# Patient Record
Sex: Female | Born: 1975 | Hispanic: No | Marital: Single | Smoking: Never smoker
Health system: Southern US, Community
[De-identification: ages and names within clinical notes are randomized; demographics above are authoritative.]

## PROBLEM LIST (undated history)

## (undated) DIAGNOSIS — F329 Major depressive disorder, single episode, unspecified: Secondary | ICD-10-CM

## (undated) DIAGNOSIS — F3289 Other specified depressive episodes: Secondary | ICD-10-CM

## (undated) DIAGNOSIS — S99921A Unspecified injury of right foot, initial encounter: Secondary | ICD-10-CM

## (undated) DIAGNOSIS — G43909 Migraine, unspecified, not intractable, without status migrainosus: Secondary | ICD-10-CM

## (undated) DIAGNOSIS — F319 Bipolar disorder, unspecified: Secondary | ICD-10-CM

## (undated) DIAGNOSIS — M3500 Sicca syndrome, unspecified: Secondary | ICD-10-CM

## (undated) HISTORY — DX: Other specified depressive episodes: F32.89

## (undated) HISTORY — DX: Major depressive disorder, single episode, unspecified: F32.9

## (undated) HISTORY — DX: Unspecified injury of right foot, initial encounter: S99.921A

## (undated) HISTORY — DX: Migraine, unspecified, not intractable, without status migrainosus: G43.909

## (undated) HISTORY — DX: Morbid (severe) obesity due to excess calories: E66.01

## (undated) HISTORY — DX: Sjogren syndrome, unspecified: M35.00

## (undated) HISTORY — DX: Bipolar disorder, unspecified: F31.9

---

## 2001-12-25 DIAGNOSIS — S99921A Unspecified injury of right foot, initial encounter: Secondary | ICD-10-CM

## 2001-12-25 HISTORY — DX: Unspecified injury of right foot, initial encounter: S99.921A

## 2001-12-25 HISTORY — PX: OTHER SURGICAL HISTORY: SHX169

## 2004-12-25 HISTORY — PX: CHOLECYSTECTOMY: SHX55

## 2006-12-25 HISTORY — PX: ABDOMINAL HYSTERECTOMY: SHX81

## 2010-02-07 ENCOUNTER — Encounter: Payer: Self-pay | Admitting: Internal Medicine

## 2010-02-07 LAB — CONVERTED CEMR LAB
RDW: 14.8 %
WBC: 6.6 10*3/uL

## 2010-05-25 LAB — CONVERTED CEMR LAB

## 2011-01-02 ENCOUNTER — Other Ambulatory Visit: Payer: Self-pay | Admitting: Internal Medicine

## 2011-01-02 ENCOUNTER — Ambulatory Visit
Admission: RE | Admit: 2011-01-02 | Discharge: 2011-01-02 | Payer: Self-pay | Source: Home / Self Care | Attending: Internal Medicine | Admitting: Internal Medicine

## 2011-01-02 DIAGNOSIS — G43909 Migraine, unspecified, not intractable, without status migrainosus: Secondary | ICD-10-CM | POA: Insufficient documentation

## 2011-01-02 DIAGNOSIS — F329 Major depressive disorder, single episode, unspecified: Secondary | ICD-10-CM | POA: Insufficient documentation

## 2011-01-02 DIAGNOSIS — F3289 Other specified depressive episodes: Secondary | ICD-10-CM | POA: Insufficient documentation

## 2011-01-02 LAB — CBC WITH DIFFERENTIAL/PLATELET
Basophils Absolute: 0 10*3/uL (ref 0.0–0.1)
Basophils Relative: 0.3 % (ref 0.0–3.0)
Eosinophils Absolute: 0.1 10*3/uL (ref 0.0–0.7)
Eosinophils Relative: 2 % (ref 0.0–5.0)
HCT: 38 % (ref 36.0–46.0)
Hemoglobin: 12.8 g/dL (ref 12.0–15.0)
Lymphocytes Relative: 28.1 % (ref 12.0–46.0)
Lymphs Abs: 1.5 10*3/uL (ref 0.7–4.0)
MCHC: 33.6 g/dL (ref 30.0–36.0)
MCV: 78 fl (ref 78.0–100.0)
Monocytes Absolute: 0.4 10*3/uL (ref 0.1–1.0)
Monocytes Relative: 6.9 % (ref 3.0–12.0)
Neutro Abs: 3.4 10*3/uL (ref 1.4–7.7)
Neutrophils Relative %: 62.7 % (ref 43.0–77.0)
Platelets: 243 10*3/uL (ref 150.0–400.0)
RBC: 4.87 Mil/uL (ref 3.87–5.11)
RDW: 16.6 % — ABNORMAL HIGH (ref 11.5–14.6)
WBC: 5.5 10*3/uL (ref 4.5–10.5)

## 2011-01-02 LAB — URINALYSIS, ROUTINE W REFLEX MICROSCOPIC
Bilirubin Urine: NEGATIVE
Hemoglobin, Urine: NEGATIVE
Ketones, ur: NEGATIVE
Leukocytes, UA: NEGATIVE
Nitrite: NEGATIVE
Specific Gravity, Urine: 1.01 (ref 1.000–1.030)
Total Protein, Urine: NEGATIVE
Urine Glucose: NEGATIVE
Urobilinogen, UA: 0.2 (ref 0.0–1.0)
pH: 6 (ref 5.0–8.0)

## 2011-01-02 LAB — HEPATIC FUNCTION PANEL
ALT: 26 U/L (ref 0–35)
AST: 19 U/L (ref 0–37)
Albumin: 4.2 g/dL (ref 3.5–5.2)
Alkaline Phosphatase: 103 U/L (ref 39–117)
Bilirubin, Direct: 0.1 mg/dL (ref 0.0–0.3)
Total Bilirubin: 0.5 mg/dL (ref 0.3–1.2)
Total Protein: 7.8 g/dL (ref 6.0–8.3)

## 2011-01-02 LAB — BASIC METABOLIC PANEL
BUN: 11 mg/dL (ref 6–23)
CO2: 28 mEq/L (ref 19–32)
Calcium: 9.6 mg/dL (ref 8.4–10.5)
Chloride: 108 mEq/L (ref 96–112)
Creatinine, Ser: 0.7 mg/dL (ref 0.4–1.2)
GFR: 96.4 mL/min (ref >60.00)
Glucose, Bld: 84 mg/dL (ref 70–99)
Potassium: 4.5 mEq/L (ref 3.5–5.1)
Sodium: 143 mEq/L (ref 135–145)

## 2011-01-02 LAB — LIPID PANEL
Cholesterol: 190 mg/dL (ref 0–200)
HDL: 28 mg/dL — ABNORMAL LOW (ref >39.00)
Total CHOL/HDL Ratio: 7
Triglycerides: 207 mg/dL — ABNORMAL HIGH (ref 0.0–149.0)
VLDL: 41.4 mg/dL — ABNORMAL HIGH (ref 0.0–40.0)

## 2011-01-02 LAB — TSH: TSH: 1.57 u[IU]/mL (ref 0.35–5.50)

## 2011-01-02 LAB — LDL CHOLESTEROL, DIRECT: Direct LDL: 130.2 mg/dL

## 2011-01-05 ENCOUNTER — Encounter: Payer: Self-pay | Admitting: Internal Medicine

## 2011-01-26 NOTE — Assessment & Plan Note (Signed)
Summary: New / cd   Vital Signs:  Patient profile:   35 year old female Height:      71 inches (180.34 cm) Weight:      235.0 pounds (106.82 kg) BMI:     32.89 O2 Sat:      97 % on Room air Temp:     98.6 degrees F (37.00 degrees C) oral Pulse rate:   80 / minute BP sitting:   102 / 72  (left arm) Cuff size:   large  Vitals Entered By: Orlan Leavens RMA (January 02, 2011 1:05 PM)  O2 Flow:  Room air CC: New patient Is Patient Diabetic? No Pain Assessment Patient in pain? no        Primary Care Provider:  Newt Lukes MD  CC:  New patient.  History of Present Illness: new pt to me and our practice, here to est care prev followed with dr. Fayrene Fearing black, gyn in [place]  patient is here today for annual physical. Patient feels well and has no complaints.   reviewed chronic med issues: bipolar depression - prev zoloft and lexapro (weight gain), then on lamictal (allg rxn) - started on abilify 11/2010 - not helpful? 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 and infx at [facility] - recurrent growth, now following at [facility] for same- ?repeat surg plans - constant pain, esp WB which limits exercise capacity  obesity - fustrated with weight gain since TAH/BSO in 2008 - complicated by tx with zoloft and lexapro for depression - follows limited calorie low fat and low carb diet but only tolerable exercise is swimming - only swims in summer  Preventive Screening-Counseling & Management  Alcohol-Tobacco     Alcohol drinks/day: 0     Alcohol Counseling: not indicated; patient does not drink     Smoking Status: never     Tobacco Counseling: not indicated; no tobacco use  Caffeine-Diet-Exercise     Does Patient Exercise: no  Safety-Violence-Falls     Seat Belt Counseling: not indicated; patient wears seat belts     Helmet Counseling: not applicable     Firearm Counseling: not applicable     Violence  Counseling: not applicable  Clinical Review Panels:  Prevention   Last Pap Smear:  Interpretation Result:Negative for intraepithelial Lesion or Malignancy.    (05/25/2010)  Immunizations   Last Tetanus Booster:  Historical (12/26/2007)   Current Medications (verified): 1)  Abilify 20 Mg Tabs (Aripiprazole) .... Take 1 By Mouth Once Daily 2)  Clonazepam 0.5 Mg Tabs (Clonazepam) .... Take 1-3 By Mouth At Bedtime  Allergies (verified): 1)  ! Cipro 2)  ! * Zosyn 3)  ! Vancomycin 4)  ! * Cephamine 5)  ! 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  Md roster: ortho onc -brigman @ [facility] psyc - [facility] ([place]) ENT - [facility]  Past Surgical History: Cholecystectomy (2006) Hysterectomy and bso - (2008) R foot tumor resection 2003  Family History: Family History of Alcoholism/Addiction (other relative) Family History of Arthritis (parent, grandparent) Family History Diabetes 1st degree relative (grandparent) Family History Hypertension (parent)  Social History: Never Smoked no alcohol single, lives with 10dogs not in relationship works with Systems developer Smoking Status:  never Does Patient Exercise:  no  Review of Systems       see HPI above. I have reviewed all other systems  and they were negative.   Physical Exam  General:  overweight-appearing.  alert, well-developed, well-nourished, and cooperative to examination.    Head:  Normocephalic and atraumatic without obvious abnormalities. No apparent alopecia or balding. Eyes:  vision grossly intact; pupils equal, round and reactive to light.  conjunctiva and lids normal.    Ears:  normal pinnae bilaterally, without erythema, swelling, or tenderness to palpation. TMs clear, without effusion, or cerumen impaction. Hearing grossly normal bilaterally  Nose:  External nasal examination shows no deformity or inflammation. Nasal mucosa are pink and  moist without lesions or exudates. Mouth:  teeth and gums in good repair; mucous membranes moist, without lesions or ulcers. oropharynx clear without exudate, no erythema.  Neck:  thick, supple, full ROM, no masses, no thyromegaly; no thyroid nodules or tenderness. no JVD or carotid bruits.   Lungs:  normal respiratory effort, no intercostal retractions or use of accessory muscles; normal breath sounds bilaterally - no crackles and no wheezes.    Heart:  normal rate, regular rhythm, no murmur, and no rub. BLE without edema.  Abdomen:  soft, non-tender, normal bowel sounds, no distention; no masses and no appreciable hepatomegaly or splenomegaly.   Genitalia:  defer gyn Msk:  diffuse soft tissue swelling and firmness around right hindfoot - scar on sole in midfoot - FROM but painful with weightbearing Neurologic:  alert & oriented X3 and cranial nerves II-XII symetrically intact.  strength normal in all extremities, sensation intact to light touch, and gait normal. speech fluent without dysarthria or aphasia; follows commands with good comprehension.  Skin:  no rashes, vesicles, ulcers, or erythema. No nodules or irregularity to palpation.  Psych:  Oriented X3, memory intact for recent and remote, normally interactive, good eye contact, not anxious appearing, not depressed appearing, and not agitated.      Impression & Recommendations:  Problem # 1:  PREVENTIVE HEALTH CARE (ICD-V70.0) Assessment New Patient has been counseled on age-appropriate routine health concerns for screening and prevention. These are reviewed and up-to-date. Immunizations are up-to-date or declined. Labs ordered and will be reviewed. 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 Take 1-3 by mouth at bedtime  Complete Medication List: 1)  Abilify 20 Mg Tabs (Aripiprazole) .... Take 1 by mouth once daily 2)  Clonazepam 0.5 Mg Tabs (Clonazepam) .... Take 1-3 by mouth at bedtime  Patient Instructions: 1)  it was good to see you today. 2)  test(s) ordered today - your results will be called to you after review in 48-72 hours from the time of test completion 3)  will send for records from dr. Vedia Coffer  to review as discussed 4)  continue to follow with your gynecologist and other specialists as ongoing including dr. Marciano Sequin, [facility]. 5)  no medication changes recommended at this time 6)  Please schedule a follow-up appointment in 3-6 months to continue review and weight check, call sooner if problems.    Orders Added: 1)  TLB-BMP (Basic Metabolic Panel-BMET) [80048-METABOL] 2)  TLB-Lipid Panel [80061-LIPID] 3)  TLB-CBC Platelet - w/Differential [85025-CBCD] 4)  TLB-Hepatic/Liver Function Pnl [80076-HEPATIC] 5)  TLB-TSH (Thyroid Stimulating Hormone) [84443-TSH] 6)  TLB-Udip w/ Micro [81001-URINE] 7)  New Patient 18-39 years [99385]   Immunization History:  Tetanus/Td Immunization History:    Tetanus/Td:  historical (12/26/2007)   Immunization History:  Tetanus/Td  Immunization History:    Tetanus/Td:  Historical (12/26/2007)    Pap Smear  Procedure date:  05/25/2010  Findings:      Interpretation Result:Negative for intraepithelial Lesion or Malignancy.

## 2011-03-15 ENCOUNTER — Encounter: Payer: Self-pay | Admitting: *Deleted

## 2011-04-04 ENCOUNTER — Ambulatory Visit: Payer: Self-pay | Admitting: Internal Medicine

## 2011-04-05 ENCOUNTER — Ambulatory Visit (INDEPENDENT_AMBULATORY_CARE_PROVIDER_SITE_OTHER): Payer: BLUE CROSS/BLUE SHIELD | Admitting: Internal Medicine

## 2011-04-05 ENCOUNTER — Encounter: Payer: Self-pay | Admitting: Internal Medicine

## 2011-04-05 VITALS — BP 110/72 | HR 74 | Temp 98.5°F | Ht 71.0 in | Wt 234.1 lb

## 2011-04-05 DIAGNOSIS — K589 Irritable bowel syndrome without diarrhea: Secondary | ICD-10-CM

## 2011-04-05 DIAGNOSIS — F3289 Other specified depressive episodes: Secondary | ICD-10-CM

## 2011-04-05 DIAGNOSIS — F329 Major depressive disorder, single episode, unspecified: Secondary | ICD-10-CM

## 2011-04-05 MED ORDER — OMEPRAZOLE MAGNESIUM 20 MG PO TBEC: 20.0000 mg | DELAYED_RELEASE_TABLET | Freq: Every day | ORAL | Status: DC

## 2011-04-05 NOTE — Assessment & Plan Note (Signed)
Counseled on need for balance of calories in/out to achieve weight loss Wt Readings from Last 3 Encounters:  04/05/11 234 lb 1.9 oz (106.196 kg)  01/02/11 235 lb (106.595 kg)

## 2011-04-05 NOTE — Assessment & Plan Note (Signed)
Increase symptoms of motivational and physical fatigue - Recent 12/2010 labs reviewed - no evidence for other medical problem to explain same Hesitation to add stimulant due to history of bipolar - the patient will review symptoms and possible med changes with her Psychiatrist in [place] ([place])

## 2011-04-05 NOTE — Progress Notes (Signed)
  Subjective:    Patient ID: Monica May, female    DOB: 1975-12-26, 35 y.o.   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 symptoms occur 2-3 x/week, not constant. describes as burning. Improved with prilosec. associated with alternating diarrhea dn constipation. No pain at this time. No weight change, no nausea and vomiting. Worse with stress, not changed with meals.  Past Medical History  Diagnosis Date  . OBESITY, MORBID 01/02/2011  . DEPRESSION 01/02/2011  . MIGRAINE HEADACHE 01/02/2011  . Bipolar affective disorder   . Injury of right foot     Tumor (R) foot    Review of Systems  Constitutional: Positive for fatigue. Negative for unexpected weight change.  Respiratory: Negative for shortness of breath.   Cardiovascular: Negative for chest pain.       Objective:   Physical Exam  Constitutional: She appears well-developed and well-nourished. No distress.       overweight  Cardiovascular: Normal rate, regular rhythm and normal heart sounds.   No murmur heard. Pulmonary/Chest: Effort normal and breath sounds normal. No respiratory distress. She has  no wheezes.  Abdominal: Soft. Bowel sounds are normal. She exhibits no distension. There is no tenderness.   BP 110/72  Pulse 74  Temp(Src) 98.5 F (36.9 C) (Oral)  Ht 5\' 11"  (1.803 m)  Wt 234 lb 1.9 oz (106.196 kg)  BMI 32.65 kg/m2  SpO2 98% Wt Readings from Last 3 Encounters:  04/05/11 234 lb 1.9 oz (106.196 kg)  01/02/11 235 lb (106.595 kg)       Lab Results  Component Value Date   WBC 5.5 01/02/2011   HGB 12.8 01/02/2011   HGB NEGATIVE 01/02/2011   HCT 38.0 01/02/2011   PLT 243.0 01/02/2011   CHOL 190 01/02/2011   TRIG 207.0* 01/02/2011   HDL 28.00* 01/02/2011   LDLDIRECT 130.2 01/02/2011   ALT 26 01/02/2011   AST 19 01/02/2011   NA 143 01/02/2011   K 4.5 01/02/2011   CL 108 01/02/2011   CREATININE 0.7 01/02/2011   BUN 11 01/02/2011   CO2 28 01/02/2011   TSH 1.57 01/02/2011    Assessment & Plan:  See problem list. Medications and labs reviewed today. Time spent with the patient 25 minutes, greater than 50% time spent counseling patient on depression, IBS and medication review. Also review of prior records

## 2011-04-05 NOTE — Assessment & Plan Note (Signed)
Alternating diarrhea with constipation and "nervous stomach" with stress No red flags on hx or exam Previously intolerant of SSRI but will review other options with Psychiatry as for increase depression symptoms recommended trial of Align or other probiotic to help with same

## 2011-04-05 NOTE — Patient Instructions (Signed)
It was good to see you today. Try Align for stomach and bowels as discussed - ok to continue prilosec as needed Copy of 1.2012 labs given to you Other medications reviewed, no changes at this time but discuss possible changeoin depression treatment with Dr. Marciano Sequin Please schedule followup in 6 months, call sooner if problems.

## 2011-04-24 ENCOUNTER — Encounter: Payer: Self-pay | Admitting: Internal Medicine

## 2011-04-24 ENCOUNTER — Ambulatory Visit (INDEPENDENT_AMBULATORY_CARE_PROVIDER_SITE_OTHER): Payer: BLUE CROSS/BLUE SHIELD | Admitting: Internal Medicine

## 2011-04-24 VITALS — BP 90/72 | HR 91 | Temp 98.6°F

## 2011-04-24 DIAGNOSIS — J020 Streptococcal pharyngitis: Secondary | ICD-10-CM

## 2011-04-24 MED ORDER — MAGIC MOUTHWASH W/LIDOCAINE: 10.0000 mL | Freq: Three times a day (TID) | ORAL | Status: DC | PRN

## 2011-04-24 MED ORDER — CLARITHROMYCIN 500 MG PO TABS: 500.0000 mg | ORAL_TABLET | Freq: Two times a day (BID) | ORAL | Status: AC

## 2011-04-24 NOTE — Progress Notes (Signed)
  Subjective:    Patient ID: Monica May, female    DOB: 1976/01/11, 35 y.o.   MRN: [id number]  HPI Here with ST - Onset 5 days ago Dx with strep in [place] at [facility] (+rapid and swab) - tx with Zpak due to pcn and ceph allg - Fever improved but still painful swallowing and swollen tonsils+LNs No sinus pain/pressure, no cough or sputum  Also reviewed chronic med issues: bipolar depression - prev zoloft and lexapro (weight gain), then on lamictal (allg rxn) - started on abilify 11/2010 - not helpful? 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 OBESITY, MORBID 01/02/2011  . DEPRESSION 01/02/2011  . MIGRAINE HEADACHE 01/02/2011  . Bipolar affective disorder     dr. Marciano Sequin ([place])  . 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 Sjogrens syndrome     Review of Systems  Constitutional: Negative for fever and fatigue.  HENT: Positive for sore throat and trouble swallowing. Negative for mouth sores and voice change.   Respiratory: Negative for cough.   Cardiovascular: Negative for chest pain.       Objective:   Physical Exam  Constitutional: She is oriented to person, place, and time. She appears well-developed and well-nourished.  HENT:  Mouth/Throat: Oropharyngeal exudate present.       OP with moderate erythema and tonsil enlargement bilaterally - no thrush  Cardiovascular: Normal rate, regular rhythm and normal heart sounds.   Pulmonary/Chest: Effort  normal and breath sounds normal. No respiratory distress.  Neurological: She is alert and oriented to person, place, and time.        Assessment & Plan:  Strep pharyngitis - change abx to different macrolide given allg to PCN and ceph abx - also topical symptomatic relief with MM+lido erx done - cx not repeated as just done and (+) with [place] provider per pt report

## 2011-04-24 NOTE — Patient Instructions (Signed)
It was good to see you today. Stop Zpak and start Biaxin - also magic mouth wash with lidocain for throat pain -  Your prescription(s) have been submitted to your pharmacy. Please take as directed and contact our office if you believe you are having problem(s) with the medication(s). Keep follow up as previously scheduled, sooner if problems

## 2011-10-04 ENCOUNTER — Ambulatory Visit (INDEPENDENT_AMBULATORY_CARE_PROVIDER_SITE_OTHER): Payer: BLUE CROSS/BLUE SHIELD | Admitting: Internal Medicine

## 2011-10-04 ENCOUNTER — Encounter: Payer: Self-pay | Admitting: Internal Medicine

## 2011-10-04 DIAGNOSIS — F329 Major depressive disorder, single episode, unspecified: Secondary | ICD-10-CM

## 2011-10-04 NOTE — Assessment & Plan Note (Signed)
Stable symptoms - well controlled at this time Recent 12/2010 labs reviewed - no evidence for other medical problem to explain same Hesitation to add stimulant due to history of bipolar, but tolerated phenmteramine well summer 2012 (only no weight loss)  the patient will monitor for increase symptoms with her Psychiatrist in [place] ([place])

## 2011-10-04 NOTE — Patient Instructions (Addendum)
It was good to see you today. Try Qsymia (may not yet be at your pharmacy) - prescription and info from FDA on safety given to you today .IF successful weight loss and you wish to continue after 60days use - cal here for visit and weight check to review medication Other Mmdications reviewed, no changes at this time. Otherwise, please schedule followup in 6 months, call sooner if problems.

## 2011-10-04 NOTE — Progress Notes (Signed)
  Subjective:    Patient ID: Monica May, female    DOB: 01/04/1976, 35 y.o.   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 symptoms occur 2-3 x/week, not constant. describes as burning. Improved with prilosec. associated with alternating diarrhea dn constipation. No pain at this time. No weight change, no nausea and vomiting. Worse with stress, not changed with meals.  Past Medical History  Diagnosis Date  . OBESITY, MORBID   . DEPRESSION   . MIGRAINE HEADACHE   . Bipolar affective disorder     dr. Marciano Sequin ([place])  . 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 Sjogrens syndrome     Review of Systems  Constitutional: Positive for fatigue. Negative for unexpected weight change.  Respiratory: Negative for shortness of breath.   Cardiovascular: Negative for chest pain.       Objective:   Physical Exam BP 118/82  Pulse 81  Temp(Src) 98.4 F (36.9 C) (Oral)  Ht 5\' 11"  (1.803 m)  Wt 235 lb 12.8 oz (106.958 kg)  BMI 32.89 kg/m2  SpO2  98% Wt Readings from Last 3 Encounters:  10/04/11 235 lb 12.8 oz (106.958 kg)  04/05/11 234 lb 1.9 oz (106.196 kg)  01/02/11 235 lb (106.595 kg)   Constitutional: She is overweight, appears well-developed and well-nourished. No distress.  Neck: Normal range of motion. Neck supple. No JVD present. No thyromegaly present.  Cardiovascular: Normal rate, regular rhythm and normal heart sounds.  No murmur heard. No BLE edema. Pulmonary/Chest: Effort normal and breath sounds normal. No respiratory distress. She has no wheezes.  Skin: Skin is warm and dry. No rash noted. No erythema.  Psychiatric: She has a normal mood and affect. Her behavior is normal. Judgment and thought content normal.       Lab Results  Component Value Date   WBC 5.5 01/02/2011   HGB 12.8 01/02/2011   HCT 38.0 01/02/2011   PLT 243.0 01/02/2011   CHOL 190 01/02/2011   TRIG 207.0* 01/02/2011   HDL 28.00* 01/02/2011   LDLDIRECT 130.2 01/02/2011   ALT 26 01/02/2011   AST 19 01/02/2011   NA 143 01/02/2011   K 4.5 01/02/2011   CL 108 01/02/2011   CREATININE 0.7 01/02/2011   BUN 11 01/02/2011   CO2 28 01/02/2011   TSH 1.57 01/02/2011    Assessment & Plan:  See problem list. Medications and labs reviewed today.

## 2011-10-04 NOTE — Assessment & Plan Note (Signed)
Counseled on need for balance of calories in/out to achieve weight loss Has tried Bhcg shots and phenteramine summer 2012 without weight loss Wants to try Qsymia - rx for 30 days done and FDA med safety guide provided as well follow up before more than 2 x to be completed for weight and efficacy check Wt Readings from Last 3 Encounters:  10/04/11 235 lb 12.8 oz (106.958 kg)  04/05/11 234 lb 1.9 oz (106.196 kg)  01/02/11 235 lb (106.595 kg)

## 2011-10-19 ENCOUNTER — Telehealth: Payer: Self-pay | Admitting: *Deleted

## 2011-10-19 NOTE — Telephone Encounter (Signed)
Received PA form for Qsymia contact insurance company faxed over forms fo md to complete. Fax info back. Received approval letter this am med approve for 6 months. Fax approval back to [facility] home delivery @ [phone]...10/19/11@10 :59am/LMB

## 2011-10-30 ENCOUNTER — Encounter: Payer: Self-pay | Admitting: Internal Medicine

## 2011-10-30 ENCOUNTER — Ambulatory Visit (INDEPENDENT_AMBULATORY_CARE_PROVIDER_SITE_OTHER): Payer: BLUE CROSS/BLUE SHIELD | Admitting: Internal Medicine

## 2011-10-30 VITALS — BP 102/72 | HR 89 | Temp 98.6°F

## 2011-10-30 DIAGNOSIS — J209 Acute bronchitis, unspecified: Secondary | ICD-10-CM

## 2011-10-30 DIAGNOSIS — B370 Candidal stomatitis: Secondary | ICD-10-CM

## 2011-10-30 MED ORDER — NYSTATIN 100000 UNIT/ML MT SUSP: 500000.0000 [IU] | Freq: Four times a day (QID) | OROMUCOSAL | Status: AC

## 2011-10-30 MED ORDER — AZITHROMYCIN 250 MG PO TABS: ORAL_TABLET | ORAL | Status: AC

## 2011-10-30 MED ORDER — PROMETHAZINE-CODEINE 6.25-10 MG/5ML PO SYRP: 5.0000 mL | ORAL_SOLUTION | ORAL | Status: AC | PRN

## 2011-10-30 NOTE — Progress Notes (Signed)
  Subjective:    Patient ID: Monica May, female    DOB: [birth year], 35 y.o.   MRN: [id number]  HPI  complains of sore throat associated with "white on tongue" and cough (dark productive sputum) Onset 2 weeks Denies recent antibiotics use No fever or shortness of breath - No relief with OTC cough meds or gargle +sick contacts at work  Past Medical History  Diagnosis Date  . OBESITY, MORBID   . DEPRESSION   . MIGRAINE HEADACHE   . Bipolar affective disorder     dr. Marciano Sequin ([place])  . 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 Sjogrens syndrome      Review of Systems  HENT: Positive for neck stiffness. Negative for hearing loss and postnasal drip.   Respiratory: Negative for shortness of breath and wheezing.        Objective:   Physical Exam BP 102/72  Pulse 89  Temp(Src) 98.6 F (37 C) (Oral)  SpO2 98% Wt Readings from Last 3 Encounters:  10/04/11 235 lb 12.8 oz (106.958 kg)  04/05/11 234 lb 1.9 oz (106.196 kg)  01/02/11 235 lb (106.595 kg)   Constitutional: She appears well-developed and well-nourished. No distress but mildly ill.  HENT: Head: Normocephalic and atraumatic. Ears: B TMs ok, no erythema or effusion; Nose: Nose normal.  Mouth/Throat: Oropharynx is beefy red with thush/exudate coating including tongue.  Eyes: Conjunctivae and EOM are normal. Pupils are equal, round, and reactive to light. No scleral icterus.  Neck: Normal range of motion. Neck supple. No JVD present. No thyromegaly present.  Cardiovascular: Normal rate, regular rhythm and normal heart sounds.  No murmur heard. No BLE edema. Pulmonary/Chest: Effort normal and breath sounds scattered rhonchi. No respiratory distress. She has no wheezes.  Psychiatric: She has a normal mood and affect. Her behavior is normal. Judgment and thought content normal.       Assessment & Plan:  Bronchitis, acute - Zpak and narcotic cough syrup  Thrush -  start nystatin and tx through next 10 days - continue probiotics

## 2011-10-30 NOTE — Patient Instructions (Signed)
It was good to see you today. Zpak antibiotics, codeine cough syrup and nystatin mouthwash - Your prescription(s) have been submitted to your pharmacy. Please take as directed and contact our office if you believe you are having problem(s) with the medication(s).

## 2012-01-10 ENCOUNTER — Ambulatory Visit (INDEPENDENT_AMBULATORY_CARE_PROVIDER_SITE_OTHER): Payer: BLUE CROSS/BLUE SHIELD | Admitting: Internal Medicine

## 2012-01-10 ENCOUNTER — Encounter: Payer: Self-pay | Admitting: Internal Medicine

## 2012-01-10 VITALS — BP 110/70 | HR 98 | Temp 97.2°F

## 2012-01-10 DIAGNOSIS — J069 Acute upper respiratory infection, unspecified: Secondary | ICD-10-CM

## 2012-01-10 MED ORDER — OSELTAMIVIR PHOSPHATE 75 MG PO CAPS: 75.0000 mg | ORAL_CAPSULE | Freq: Two times a day (BID) | ORAL | Status: AC

## 2012-01-10 NOTE — Progress Notes (Signed)
  Subjective:    HPI  complains of cold symptoms  Onset 3 days ago, wax/wane symptoms  associated with rhinorrhea, sneezing, mild headache and fever 101-102 at home Also myalgias, fatigue and mild chest congestion>min productive clear sputum No relief with OTC meds Precipitated by sick contacts  Past Medical History  Diagnosis Date  . OBESITY, MORBID   . DEPRESSION   . MIGRAINE HEADACHE   . Bipolar affective disorder     dr. Marciano Sequin ([place])  . 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 Sjogrens syndrome     Review of Systems Constitutional: No night sweats, no unexpected weight change Pulmonary: No pleurisy or hemoptysis Cardiovascular: No chest pain or palpitations     Objective:   Physical Exam BP 110/70  Pulse 98  Temp(Src) 97.2 F (36.2 C) (Oral)  SpO2 98% GEN: mildly ill appearing and audible head congestion HENT: NCAT, no sinus tenderness bilaterally, nares with clear discharge, oropharynx mild erythema, no exudate Eyes: Vision grossly intact, no conjunctivitis Lungs: Clear to auscultation without rhonchi or wheeze, no increased work of breathing Cardiovascular: Regular rate and rhythm, no bilateral edema      Assessment & Plan:  Viral URI >>suspect flu Cough, postnasal drip related to above   Explained lack of efficacy for antibiotics in viral disease Empiric Tamiflu prescribed due to symptom duration greater than 7 days recommended OTC cough suppression - new prescriptions done Symptomatic care with Tylenol or Advil, hydration and rest -  salt gargle advised as needed

## 2012-01-10 NOTE — Patient Instructions (Signed)
It was good to see you today. Tamiflu - Your prescription(s) have been submitted to your pharmacy. Please take as directed and contact our office if you believe you are having problem(s) with the medication(s). Hydrate, rest and Alternate between ibuprofen and tylenol for aches, pain and fever symptoms as discussed Call if symptoms worse or unimproved

## 2012-02-20 ENCOUNTER — Encounter: Payer: Self-pay | Admitting: Internal Medicine

## 2012-02-20 ENCOUNTER — Ambulatory Visit (INDEPENDENT_AMBULATORY_CARE_PROVIDER_SITE_OTHER)
Admission: RE | Admit: 2012-02-20 | Discharge: 2012-02-20 | Disposition: A | Discharge status: Home / Self Care | Payer: BLUE CROSS/BLUE SHIELD | Source: Ambulatory Visit | Attending: Internal Medicine | Admitting: Internal Medicine

## 2012-02-20 ENCOUNTER — Ambulatory Visit (INDEPENDENT_AMBULATORY_CARE_PROVIDER_SITE_OTHER): Payer: BLUE CROSS/BLUE SHIELD | Admitting: Internal Medicine

## 2012-02-20 VITALS — BP 102/68 | HR 117 | Temp 102.1°F

## 2012-02-20 DIAGNOSIS — R05 Cough: Secondary | ICD-10-CM

## 2012-02-20 DIAGNOSIS — R509 Fever, unspecified: Secondary | ICD-10-CM

## 2012-02-20 MED ORDER — DOXYCYCLINE HYCLATE 100 MG PO TABS: 100.0000 mg | ORAL_TABLET | Freq: Two times a day (BID) | ORAL | Status: AC

## 2012-02-20 MED ORDER — HYDROCODONE-HOMATROPINE 5-1.5 MG/5ML PO SYRP: 5.0000 mL | ORAL_SOLUTION | Freq: Four times a day (QID) | ORAL | Status: AC | PRN

## 2012-02-20 NOTE — Patient Instructions (Signed)
It was good to see you today. Doxycycline antibiotics and Hydromet for cough - Your prescription(s) have been submitted to your pharmacy. Please take as directed and contact our office if you believe you are having problem(s) with the medication(s). Chest x-ray ordered today. Your results will be called to you after review (48-72hours after test completion). If any changes need to be made, you will be notified at that time. Hydrate, rest and Alternate between ibuprofen and tylenol for aches, pain and fever symptoms as discussed Remain out of work until fever resolved for 24 hours Call if symptoms worse or unimproved

## 2012-02-20 NOTE — Progress Notes (Signed)
  Subjective:    Patient ID: Monica May, female    DOB: [birth year], 36 y.o.   MRN: [id number]  HPI  Complains of URI symptoms: Myalgia, dry cough Onset 3 days ago, rapidly progressive Associated with mild sore throat, mild headache and overwhelming fatigue -  Also associated with mild nausea but no vomiting, abdominal pain or diarrhea Seen approximately one month ago here for similar symptoms - Treated at that time with empiric Tamiflu and improved until current symptoms 3 days ago  Past Medical History  Diagnosis Date  . OBESITY, MORBID   . DEPRESSION   . MIGRAINE HEADACHE   . Bipolar affective disorder     dr. Marciano Sequin ([place])  . 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 Sjogrens syndrome     Review of Systems  Constitutional: Positive for fever, chills and fatigue. Negative for unexpected weight change.  HENT: Negative for ear pain, congestion, rhinorrhea, sneezing, neck pain and sinus pressure.   Respiratory: Negative for shortness of breath and wheezing.   Cardiovascular: Negative for chest pain.  Gastrointestinal: Negative for vomiting, abdominal pain and diarrhea.  Genitourinary: Negative for dysuria and flank pain.       Objective:   Physical Exam BP 102/68  Pulse 117  Temp(Src) 102.1 F (38.9 C) (Oral)  SpO2 96% General: Flushed and mildly ill appearing, fatigued but no acute distress HENT: Normocephalic atraumatic, sinuses nontender to palpation, ears without effusion or erythema. Oropharynx without erythema or exudate Eyes: No conjunctivitis Lungs: No increased work of breathing, few scattered rhonchi, no wheeze Cardiovascular: Slight tachycardia, regular rate and rhythm, no bilateral ankle edema Abd: Obese, soft, nontender nondistended, positive bowel sounds Neuro: Awake alert oriented x4, cranial nerves II through XII intact, follows commands, results 2012. Normal coordination, balance and cognition  Lab  Results  Component Value Date   WBC 5.5 01/02/2011   HGB 12.8 01/02/2011   HCT 38.0 01/02/2011   PLT 243.0 01/02/2011   GLUCOSE 84 01/02/2011   CHOL 190 01/02/2011   TRIG 207.0* 01/02/2011   HDL 28.00* 01/02/2011   LDLDIRECT 130.2 01/02/2011   ALT 26 01/02/2011   AST 19 01/02/2011   NA 143 01/02/2011   K 4.5 01/02/2011   CL 108 01/02/2011   CREATININE 0.7 01/02/2011   BUN 11 01/02/2011   CO2 28 01/02/2011   TSH 1.57 01/02/2011        Assessment & Plan:   cough, recurrent URI symptoms Fever Nausea alone, related to cough  Check chest x-ray Empiric doxycycline and hydromet cough suppression Symptomatic care with Tylenol/ibuprofen and hydration To remain out of work until fever resolved x24 hours Call if symptoms worse or unimproved in next 48 hours to reconsider repeat Tamiflu course

## 2012-04-03 ENCOUNTER — Ambulatory Visit (INDEPENDENT_AMBULATORY_CARE_PROVIDER_SITE_OTHER): Payer: BLUE CROSS/BLUE SHIELD | Admitting: Internal Medicine

## 2012-04-03 ENCOUNTER — Other Ambulatory Visit (INDEPENDENT_AMBULATORY_CARE_PROVIDER_SITE_OTHER): Payer: BLUE CROSS/BLUE SHIELD

## 2012-04-03 ENCOUNTER — Telehealth: Payer: Self-pay | Admitting: *Deleted

## 2012-04-03 ENCOUNTER — Encounter: Payer: Self-pay | Admitting: Internal Medicine

## 2012-04-03 VITALS — BP 98/72 | HR 89 | Temp 98.2°F | Ht 71.0 in | Wt 237.8 lb

## 2012-04-03 DIAGNOSIS — Z0000 Encounter for general adult medical examination without abnormal findings: Secondary | ICD-10-CM

## 2012-04-03 LAB — LIPID PANEL
Cholesterol: 195 mg/dL (ref 0–200)
HDL: 26.6 mg/dL — ABNORMAL LOW (ref >39.00)
LDL Cholesterol: 141 mg/dL — ABNORMAL HIGH (ref 0–99)
Total CHOL/HDL Ratio: 7
Triglycerides: 137 mg/dL (ref 0.0–149.0)
VLDL: 27.4 mg/dL (ref 0.0–40.0)

## 2012-04-03 LAB — BASIC METABOLIC PANEL
CO2: 25 mEq/L (ref 19–32)
Calcium: 9.6 mg/dL (ref 8.4–10.5)
Chloride: 110 mEq/L (ref 96–112)
Creatinine, Ser: 1 mg/dL (ref 0.4–1.2)
Sodium: 143 mEq/L (ref 135–145)

## 2012-04-03 LAB — CBC WITH DIFFERENTIAL/PLATELET
Basophils Relative: 0.5 % (ref 0.0–3.0)
Eosinophils Relative: 1.7 % (ref 0.0–5.0)
Hemoglobin: 12.7 g/dL (ref 12.0–15.0)
Lymphocytes Relative: 33.8 % (ref 12.0–46.0)
MCV: 80.9 fl (ref 78.0–100.0)
Neutro Abs: 3 10*3/uL (ref 1.4–7.7)
Neutrophils Relative %: 57.9 % (ref 43.0–77.0)
RBC: 4.78 Mil/uL (ref 3.87–5.11)
WBC: 5.3 10*3/uL (ref 4.5–10.5)

## 2012-04-03 LAB — HEPATIC FUNCTION PANEL
Albumin: 4.4 g/dL (ref 3.5–5.2)
Alkaline Phosphatase: 135 U/L — ABNORMAL HIGH (ref 39–117)
Total Protein: 8.6 g/dL — ABNORMAL HIGH (ref 6.0–8.3)

## 2012-04-03 LAB — URINALYSIS
Ketones, ur: NEGATIVE
Specific Gravity, Urine: 1.015 (ref 1.000–1.030)
Total Protein, Urine: NEGATIVE
Urine Glucose: NEGATIVE

## 2012-04-03 NOTE — Telephone Encounter (Signed)
Message copied by Deatra James on Wed Apr 03, 2012 11:35 AM ------      Message from: COUSIN, SHARON T      Created: Wed Apr 03, 2012 10:01 AM      Regarding: PHY DATE  04/04/13       THANKS

## 2012-04-03 NOTE — Progress Notes (Signed)
Subjective:    Patient ID: Monica May, female    DOB: 1976-10-18, 36 y.o.   MRN: [id number]  HPI patient is here today for annual physical. Patient feels well overall and has no complaints.  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 and infx at [facility] - recurrent growth, now following at [facility] for same- ?repeat surg plans - constant pain, esp WB which limits exercise capacity  obesity - fustrated with weight gain since TAH/BSO in 2008 - complicated by tx with zoloft and lexapro for depression - follows limited calorie low fat and low carb diet but only tolerable exercise is swimming - only swims in summer - tried phenteramine and Bhcg shots at weight loss clinic without success, home trial Karie Mainland poorly tolerated due  30day trial topamax/phentermine  IBS hx -  "nervous stomach" - ache in epigastric region and burning. symptoms occur 2-3 x/week, not constant. describes as burning. Improved with prilosec. associated with alternating diarrhea dn constipation. No pain at this time. No weight change, no nausea and vomiting. Worse with stress, not changed with meals.  Past Medical History  Diagnosis Date  . OBESITY, MORBID   . DEPRESSION   . MIGRAINE HEADACHE   . Bipolar affective disorder     dr. Marciano Sequin ([place])  . 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 Sjogrens syndrome    Family History  Problem Relation Age of Onset  . Arthritis Mother   . Hypertension Mother   . Diabetes Maternal Grandmother   . Alcohol abuse Other    History  Substance Use Topics  . Smoking status: Never  Smoker   . 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 Alcohol Use: No    Review of Systems  Constitutional: Positive for fatigue. Negative for unexpected weight change.  Respiratory: Negative for shortness of breath.   Cardiovascular: Negative for chest pain.  Gastrointestinal: Negative for abdominal pain, no bowel changes.  Musculoskeletal: Negative for gait problem or joint swelling.  Skin: Negative for rash.  Neurological: Negative for dizziness or headache.  No other specific complaints in a complete review of systems (except as listed in HPI above).      Objective:   Physical Exam  BP 98/72  Pulse 89  Temp(Src) 98.2 F (36.8 C) (Oral)  Ht 5\' 11"  (1.803 m)  Wt 237 lb 12.8 oz (107.865 kg)  BMI 33.17 kg/m2  SpO2 98% Wt Readings from Last 3 Encounters:  04/03/12 237 lb 12.8 oz (107.865 kg)  10/04/11 235 lb 12.8 oz (106.958 kg)  04/05/11 234 lb 1.9 oz (106.196 kg)   Constitutional: She is overweight, appears well-developed and well-nourished. No distress.  HENT: Head: Normocephalic and atraumatic. Ears: B TMs ok, no erythema or effusion; Nose: Nose normal. Mouth/Throat: Oropharynx is clear and moist. No oropharyngeal exudate.  Eyes: Conjunctivae and EOM are normal. Pupils are equal, round, and reactive to light. No scleral icterus.  Neck: Normal range of motion. Neck supple. No JVD present. No thyromegaly present.  Cardiovascular:  Normal rate, regular rhythm and normal heart sounds.  No murmur heard. No BLE edema. Pulmonary/Chest: Effort normal and breath sounds normal. No respiratory distress. She has no wheezes.  Abdominal: Soft. Bowel sounds are normal. She exhibits no distension. There is no tenderness. no masses Musculoskeletal: Normal range of motion, no joint effusions. No gross deformities Neurological: She is alert and oriented to person, place, and time. No cranial nerve deficit.  Coordination normal.  Skin: Skin is warm and dry. No rash noted. No erythema.  Psychiatric: She has a normal mood and affect. Her behavior is normal. Judgment and thought content normal.   Lab Results  Component Value Date   WBC 5.5 01/02/2011   HGB 12.8 01/02/2011   HCT 38.0 01/02/2011   PLT 243.0 01/02/2011   GLUCOSE 84 01/02/2011   CHOL 190 01/02/2011   TRIG 207.0* 01/02/2011   HDL 28.00* 01/02/2011   LDLDIRECT 130.2 01/02/2011   ALT 26 01/02/2011   AST 19 01/02/2011   NA 143 01/02/2011   K 4.5 01/02/2011   CL 108 01/02/2011   CREATININE 0.7 01/02/2011   BUN 11 01/02/2011   CO2 28 01/02/2011   TSH 1.57 01/02/2011        Assessment & Plan:   CPX/v70.0 - Patient has been counseled on age-appropriate routine health concerns for screening and prevention. These are reviewed and up-to-date. Immunizations are up-to-date or declined. Labs ordered and will be  reviewed.

## 2012-04-03 NOTE — Telephone Encounter (Signed)
Received staff msg pt schedule cpx 04/04/13 need labs entered in epic... 04/03/12@11 :35am/LMB

## 2012-04-03 NOTE — Patient Instructions (Signed)
It was good to see you today. Test(s) ordered today. Your results will be called to you and mailed to you for your records after review (48-72hours after test completion). If any changes need to be made, you will be notified at that time. Health Maintenance reviewed - all recommended immunizations and screening are up to date. Medications reviewed, no changes at this time.   Please schedule followup in 1 year for physical and labs, call sooner if problems.

## 2013-04-04 ENCOUNTER — Encounter: Payer: BLUE CROSS/BLUE SHIELD | Admitting: Internal Medicine

## 2013-04-21 ENCOUNTER — Other Ambulatory Visit (INDEPENDENT_AMBULATORY_CARE_PROVIDER_SITE_OTHER): Payer: BLUE CROSS/BLUE SHIELD

## 2013-04-21 ENCOUNTER — Ambulatory Visit (INDEPENDENT_AMBULATORY_CARE_PROVIDER_SITE_OTHER): Payer: BLUE CROSS/BLUE SHIELD | Admitting: Internal Medicine

## 2013-04-21 ENCOUNTER — Encounter: Payer: Self-pay | Admitting: Internal Medicine

## 2013-04-21 VITALS — BP 102/78 | HR 103 | Temp 98.2°F | Wt 255.4 lb

## 2013-04-21 DIAGNOSIS — Z0000 Encounter for general adult medical examination without abnormal findings: Secondary | ICD-10-CM

## 2013-04-21 DIAGNOSIS — F329 Major depressive disorder, single episode, unspecified: Secondary | ICD-10-CM

## 2013-04-21 LAB — CBC WITH DIFFERENTIAL/PLATELET
Basophils Absolute: 0 10*3/uL (ref 0.0–0.1)
Eosinophils Relative: 2.4 % (ref 0.0–5.0)
Lymphocytes Relative: 30.4 % (ref 12.0–46.0)
Lymphs Abs: 1.7 10*3/uL (ref 0.7–4.0)
Monocytes Relative: 6.7 % (ref 3.0–12.0)
Neutrophils Relative %: 60 % (ref 43.0–77.0)
Platelets: 244 10*3/uL (ref 150.0–400.0)
RDW: 15.9 % — ABNORMAL HIGH (ref 11.5–14.6)
WBC: 5.6 10*3/uL (ref 4.5–10.5)

## 2013-04-21 LAB — URINALYSIS, ROUTINE W REFLEX MICROSCOPIC
Bilirubin Urine: NEGATIVE
Leukocytes, UA: NEGATIVE
Nitrite: NEGATIVE
Total Protein, Urine: NEGATIVE
pH: 6 (ref 5.0–8.0)

## 2013-04-21 LAB — BASIC METABOLIC PANEL
BUN: 12 mg/dL (ref 6–23)
Calcium: 9.4 mg/dL (ref 8.4–10.5)
Creatinine, Ser: 1 mg/dL (ref 0.4–1.2)
GFR: 65.43 mL/min (ref >60.00)
Glucose, Bld: 95 mg/dL (ref 70–99)
Potassium: 4.2 mEq/L (ref 3.5–5.1)

## 2013-04-21 LAB — HEPATIC FUNCTION PANEL
ALT: 42 U/L — ABNORMAL HIGH (ref 0–35)
AST: 31 U/L (ref 0–37)
Alkaline Phosphatase: 119 U/L — ABNORMAL HIGH (ref 39–117)
Bilirubin, Direct: 0.1 mg/dL (ref 0.0–0.3)
Total Bilirubin: 0.7 mg/dL (ref 0.3–1.2)
Total Protein: 8.8 g/dL — ABNORMAL HIGH (ref 6.0–8.3)

## 2013-04-21 LAB — TSH: TSH: 1.74 u[IU]/mL (ref 0.35–5.50)

## 2013-04-21 LAB — LDL CHOLESTEROL, DIRECT: Direct LDL: 126.9 mg/dL

## 2013-04-21 LAB — LIPID PANEL: HDL: 16.9 mg/dL — ABNORMAL LOW (ref >39.00)

## 2013-04-21 MED ORDER — LORCASERIN HCL 10 MG PO TABS: 10.0000 mg | ORAL_TABLET | Freq: Two times a day (BID) | ORAL | Status: DC

## 2013-04-21 NOTE — Progress Notes (Signed)
Subjective:    Patient ID: Monica May, female    DOB: [birth year], 37 y.o.   MRN: [id number]  HPI  Patient here for annual exam.  No new complaints.  Would like to talk about weight.  Has been swimming during the summertime and stationary bike during the winter months.  Struggling with time management so is only getting to eat one big meal a day. Would like to talk about potential weight loss medications and strategies to lose weight.  also reviewed chronic medical issues  bipolar depression - prev zoloft and lexapro (weight gain), then on lamictal (allg rxn) - started on abilify 11/2010 - states that she is doing well and that psych is gradually decreasing the dose.  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    IBS hx -  "nervous stomach" - ache in epigastric region and burning. symptoms occur 2-3 x/week, not constant. describes as burning. Improved with prilosec. associated with alternating diarrhea dn constipation. No pain at this time. No weight change, no nausea and vomiting. Worse with stress, not changed with meals.  Past Medical History  Diagnosis Date  . OBESITY, MORBID   . DEPRESSION   . MIGRAINE HEADACHE   . Bipolar affective disorder     dr. Marciano Sequin ([place])  . 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 Sjogrens syndrome    Family History  Problem  Relation Age of Onset  . Arthritis Mother   . Hypertension Mother   . Diabetes Maternal Grandmother   . Alcohol abuse Other    History  Substance Use Topics  . Smoking status: Never Smoker   . 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 Alcohol Use: No        Review of Systems  Constitutional: Positive for fatigue. Negative for unexpected weight change.  Respiratory: Negative for shortness of breath.   Cardiovascular: Negative for chest pain.       Objective:   Physical Exam  Constitutional: She is overweight, appears well-developed and well-nourished. No distress.  HENT: Head: Normocephalic and atraumatic. Ears: B TMs ok, no erythema or effusion; Nose: Nose normal. Mouth/Throat: Oropharynx is clear and moist. No oropharyngeal exudate.  Eyes: Conjunctivae and EOM are normal. Pupils are equal, round, and reactive to light. No scleral icterus.  Neck: Normal range of motion. Neck supple. No JVD present. No thyromegaly present.  Cardiovascular: Normal rate, regular rhythm and normal heart sounds.  No murmur heard. No BLE edema. Pulmonary/Chest: Effort normal and breath sounds normal. No respiratory distress. She has no wheezes.  Abdominal: Soft. Bowel sounds are normal. She exhibits no distension. There is no tenderness. no masses Musculoskeletal: Normal range of motion, no joint effusions. No gross deformities Neurological: She is alert and oriented to person, place, and time. Marland Kitchen  Skin: Skin is warm and dry. No rash noted. No erythema.  Psychiatric: She has a normal mood and affect. Her behavior is normal. Judgment and thought content normal.         Assessment & Plan:  Patient here for well person exam  Counseled on weight loss strategies, and prescribed Belviq 10 mg QD  Labs pending, immunizations are up to date or declined.  All health maintenance and screenings are up to date.

## 2013-04-21 NOTE — Assessment & Plan Note (Signed)
Counseled on need for balance of calories in/out to achieve weight loss Has tried Bhcg shots and phenteramine summer 2012 without weight loss trial Qsymia 09/2011 x 30 days ineffective Try Belviq now - rx done - we reviewed potential risk/benefit and possible side effects - pt understands and agrees to same  follow up 3 months for weight check, sooner if problems  Wt Readings from Last 3 Encounters:  04/21/13 255 lb 6.4 oz (115.849 kg)  04/03/12 237 lb 12.8 oz (107.865 kg)  10/04/11 235 lb 12.8 oz (106.958 kg)

## 2013-04-21 NOTE — Progress Notes (Signed)
Subjective:    Patient ID: Monica May, female    DOB: 06-10-1976, 37 y.o.   MRN: [id number]  HPI patient is here today for annual physical. Patient feels well overall and has no complaints.   Past Medical History  Diagnosis Date  . OBESITY, MORBID   . DEPRESSION   . MIGRAINE HEADACHE   . Bipolar affective disorder     dr. Marciano Sequin ([place])  . 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 Sjogrens syndrome    Family History  Problem Relation Age of Onset  . Arthritis Mother   . Hypertension Mother   . Diabetes Maternal Grandmother   . Alcohol abuse Other    History  Substance Use Topics  . Smoking status: Never Smoker   . 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 Alcohol Use: No    Review of Systems  Constitutional: Positive for fatigue. Negative for unexpected weight change.  Respiratory: Negative for shortness of breath.   Cardiovascular: Negative for chest pain.  Gastrointestinal: Negative for abdominal pain, no bowel changes.  Musculoskeletal: Negative for gait problem or joint swelling.  Skin: Negative for rash.  Neurological: Negative for dizziness or headache.  No other specific complaints in a complete review of systems (except as listed in HPI above).      Objective:   Physical Exam  BP 102/78  Pulse 103  Temp(Src) 98.2 F (36.8 C) (Oral)  Wt 255 lb 6.4 oz (115.849 kg)  BMI 35.64 kg/m2  SpO2 96% Wt Readings from Last 3 Encounters:  04/21/13 255 lb 6.4 oz (115.849 kg)  04/03/12 237 lb 12.8 oz (107.865 kg)  10/04/11 235 lb 12.8 oz (106.958 kg)   Constitutional: She is overweight, appears well-developed and well-nourished. No distress.   HENT: Head: Normocephalic and atraumatic. Ears: B TMs ok, no erythema or effusion; Nose: Nose normal. Mouth/Throat: Oropharynx is clear and moist. No oropharyngeal exudate.  Eyes:  Conjunctivae and EOM are normal. Pupils are equal, round, and reactive to light. No scleral icterus.  Neck: Normal range of motion. Neck supple. No JVD present. No thyromegaly present.  Cardiovascular: Normal rate, regular rhythm and normal heart sounds.  No murmur heard. No BLE edema. Pulmonary/Chest: Effort normal and breath sounds normal. No respiratory distress. She has no wheezes.  Abdominal: Soft. Bowel sounds are normal. She exhibits no distension. There is no tenderness. no masses Musculoskeletal: Normal range of motion, no joint effusions. No gross deformities Neurological: She is alert and oriented to person, place, and time. No cranial nerve deficit. Coordination, balance, strength, speech and gait are normal.  Skin: Skin is warm and dry. No rash noted. No erythema.  Psychiatric: She has a normal mood and affect. Her behavior is normal. Judgment and thought content normal.    Lab Results  Component Value Date   WBC 5.3 04/03/2012   HGB 12.7 04/03/2012   HCT 38.7 04/03/2012   PLT 237.0 04/03/2012   GLUCOSE 93 04/03/2012   CHOL 195 04/03/2012   TRIG 137.0 04/03/2012   HDL 26.60* 04/03/2012   LDLDIRECT 130.2 01/02/2011   LDLCALC 141* 04/03/2012   ALT 39* 04/03/2012   AST 29 04/03/2012   NA 143 04/03/2012   K 4.5 04/03/2012   CL 110 04/03/2012   CREATININE 1.0 04/03/2012   BUN 15 04/03/2012   CO2 25 04/03/2012  TSH 1.77 04/03/2012        Assessment & Plan:   CPX/v70.0 - Patient has been counseled on age-appropriate routine health concerns for screening and prevention. These are reviewed and up-to-date. Immunizations are up-to-date or declined. Labs ordered and will be reviewed.

## 2013-04-21 NOTE — Patient Instructions (Signed)
It was good to see you today. Health Maintenance reviewed - all recommended immunizations and screening are up to date. Test(s) ordered today. Your results will be released to MyChart (or called to you) after review, usually within 72hours after test completion. If any changes need to be made, you will be notified at that same time. Medications reviewed and updated, try Belviq 2x/day for weight loss - no other changes recommended at this time. Please schedule followup in 1 year for physical and labs, call sooner if problems.   Health Maintenance, Females A healthy lifestyle and preventative care can promote health and wellness.  Maintain regular health, dental, and eye exams.  Eat a healthy diet. Foods like vegetables, fruits, whole grains, low-fat dairy products, and lean protein foods contain the nutrients you need without too many calories. Decrease your intake of foods high in solid fats, added sugars, and salt. Get information about a proper diet from your caregiver, if necessary.  Regular physical exercise is one of the most important things you can do for your health. Most adults should get at least 150 minutes of moderate-intensity exercise (any activity that increases your heart rate and causes you to sweat) each week. In addition, most adults need muscle-strengthening exercises on 2 or more days a week.   Maintain a healthy weight. The body mass index (BMI) is a screening tool to identify possible weight problems. It provides an estimate of body fat based on height and weight. Your caregiver can help determine your BMI, and can help you achieve or maintain a healthy weight. For adults 20 years and older:  A BMI below 18.5 is considered underweight.  A BMI of 18.5 to 24.9 is normal.  A BMI of 25 to 29.9 is considered overweight.  A BMI of 30 and above is considered obese.  Maintain normal blood lipids and cholesterol by exercising and minimizing your intake of saturated fat. Eat a  balanced diet with plenty of fruits and vegetables. Blood tests for lipids and cholesterol should begin at age 59 and be repeated every 5 years. If your lipid or cholesterol levels are high, you are over 50, or you are a high risk for heart disease, you may need your cholesterol levels checked more frequently.Ongoing high lipid and cholesterol levels should be treated with medicines if diet and exercise are not effective.  If you smoke, find out from your caregiver how to quit. If you do not use tobacco, do not start.  If you are pregnant, do not drink alcohol. If you are breastfeeding, be very cautious about drinking alcohol. If you are not pregnant and choose to drink alcohol, do not exceed 1 drink per day. One drink is considered to be 12 ounces (355 mL) of beer, 5 ounces (148 mL) of wine, or 1.5 ounces (44 mL) of liquor.  Avoid use of street drugs. Do not share needles with anyone. Ask for help if you need support or instructions about stopping the use of drugs.  High blood pressure causes heart disease and increases the risk of stroke. Blood pressure should be checked at least every 1 to 2 years. Ongoing high blood pressure should be treated with medicines, if weight loss and exercise are not effective.  If you are 25 to 38 years old, ask your caregiver if you should take aspirin to prevent strokes.  Diabetes screening involves taking a blood sample to check your fasting blood sugar level. This should be done once every 3 years, after age 11,  if you are within normal weight and without risk factors for diabetes. Testing should be considered at a younger age or be carried out more frequently if you are overweight and have at least 1 risk factor for diabetes.  Breast cancer screening is essential preventative care for women. You should practice "breast self-awareness." This means understanding the normal appearance and feel of your breasts and may include breast self-examination. Any changes  detected, no matter how small, should be reported to a caregiver. Women in their 89s and 30s should have a clinical breast exam (CBE) by a caregiver as part of a regular health exam every 1 to 3 years. After age 15, women should have a CBE every year. Starting at age 56, women should consider having a mammogram (breast X-ray) every year. Women who have a family history of breast cancer should talk to their caregiver about genetic screening. Women at a high risk of breast cancer should talk to their caregiver about having an MRI and a mammogram every year.  The Pap test is a screening test for cervical cancer. Women should have a Pap test starting at age 74. Between ages 31 and 89, Pap tests should be repeated every 2 years. Beginning at age 33, you should have a Pap test every 3 years as long as the past 3 Pap tests have been normal. If you had a hysterectomy for a problem that was not cancer or a condition that could lead to cancer, then you no longer need Pap tests. If you are between ages 43 and 57, and you have had normal Pap tests going back 10 years, you no longer need Pap tests. If you have had past treatment for cervical cancer or a condition that could lead to cancer, you need Pap tests and screening for cancer for at least 20 years after your treatment. If Pap tests have been discontinued, risk factors (such as a new sexual partner) need to be reassessed to determine if screening should be resumed. Some women have medical problems that increase the chance of getting cervical cancer. In these cases, your caregiver may recommend more frequent screening and Pap tests.  The human papillomavirus (HPV) test is an additional test that may be used for cervical cancer screening. The HPV test looks for the virus that can cause the cell changes on the cervix. The cells collected during the Pap test can be tested for HPV. The HPV test could be used to screen women aged 28 years and older, and should be used in  women of any age who have unclear Pap test results. After the age of 28, women should have HPV testing at the same frequency as a Pap test.  Colorectal cancer can be detected and often prevented. Most routine colorectal cancer screening begins at the age of 2 and continues through age 76. However, your caregiver may recommend screening at an earlier age if you have risk factors for colon cancer. On a yearly basis, your caregiver may provide home test kits to check for hidden blood in the stool. Use of a small camera at the end of a tube, to directly examine the colon (sigmoidoscopy or colonoscopy), can detect the earliest forms of colorectal cancer. Talk to your caregiver about this at age 53, when routine screening begins. Direct examination of the colon should be repeated every 5 to 10 years through age 21, unless early forms of pre-cancerous polyps or small growths are found.  Hepatitis C blood testing is recommended for  all people born from [birth year] through 1966 and any individual with known risks for hepatitis C.  Practice safe sex. Use condoms and avoid high-risk sexual practices to reduce the spread of sexually transmitted infections (STIs). Sexually active women aged 74 and younger should be checked for Chlamydia, which is a common sexually transmitted infection. Older women with new or multiple partners should also be tested for Chlamydia. Testing for other STIs is recommended if you are sexually active and at increased risk.  Osteoporosis is a disease in which the bones lose minerals and strength with aging. This can result in serious bone fractures. The risk of osteoporosis can be identified using a bone density scan. Women ages 29 and over and women at risk for fractures or osteoporosis should discuss screening with their caregivers. Ask your caregiver whether you should be taking a calcium supplement or vitamin D to reduce the rate of osteoporosis.  Menopause can be associated with physical  symptoms and risks. Hormone replacement therapy is available to decrease symptoms and risks. You should talk to your caregiver about whether hormone replacement therapy is right for you.  Use sunscreen with a sun protection factor (SPF) of 30 or greater. Apply sunscreen liberally and repeatedly throughout the day. You should seek shade when your shadow is shorter than you. Protect yourself by wearing long sleeves, pants, a wide-brimmed hat, and sunglasses year round, whenever you are outdoors.  Notify your caregiver of new moles or changes in moles, especially if there is a change in shape or color. Also notify your caregiver if a mole is larger than the size of a pencil eraser.  Stay current with your immunizations. Document Released: 06/26/2011 Document Revised: 03/04/2012 Document Reviewed: 06/26/2011 [facility] Patient Information 2013 [place], [place]. Exercise to Lose Weight Exercise and a healthy diet may help you lose weight. Your doctor may suggest specific exercises. EXERCISE IDEAS AND TIPS  Choose low-cost things you enjoy doing, such as walking, bicycling, or exercising to workout videos.  Take stairs instead of the elevator.  Walk during your lunch break.  Park your car further away from work or school.  Go to a gym or an exercise class.  Start with 5 to 10 minutes of exercise each day. Build up to 30 minutes of exercise 4 to 6 days a week.  Wear shoes with good support and comfortable clothes.  Stretch before and after working out.  Work out until you breathe harder and your heart beats faster.  Drink extra water when you exercise.  Do not do so much that you hurt yourself, feel dizzy, or get very short of breath. Exercises that burn about 150 calories:  Running 1  miles in 15 minutes.  Playing volleyball for 45 to 60 minutes.  Washing and waxing a car for 45 to 60 minutes.  Playing touch football for 45 minutes.  Walking 1  miles in 35 minutes.  Pushing a  stroller 1  miles in 30 minutes.  Playing basketball for 30 minutes.  Raking leaves for 30 minutes.  Bicycling 5 miles in 30 minutes.  Walking 2 miles in 30 minutes.  Dancing for 30 minutes.  Shoveling snow for 15 minutes.  Swimming laps for 20 minutes.  Walking up stairs for 15 minutes.  Bicycling 4 miles in 15 minutes.  Gardening for 30 to 45 minutes.  Jumping rope for 15 minutes.  Washing windows or floors for 45 to 60 minutes. Document Released: 01/13/2011 Document Revised: 03/04/2012 Document Reviewed: 01/13/2011 [employer] Patient Information  [address], [place].

## 2013-04-21 NOTE — Assessment & Plan Note (Signed)
Stable symptoms - well controlled at this time Follows regularly with her Psychiatrist in [place] ([place])

## 2013-04-25 ENCOUNTER — Other Ambulatory Visit: Payer: Self-pay | Admitting: *Deleted

## 2013-04-25 NOTE — Telephone Encounter (Signed)
Received fax pt needing PA on belviq. Notified insurance fax over PA from has been completed and fax back. Waiting on approval status...Raechel Chute

## 2013-05-01 ENCOUNTER — Telehealth: Payer: Self-pay | Admitting: Internal Medicine

## 2013-05-01 NOTE — Telephone Encounter (Signed)
Received call from [place] w/ [employer] Belviq has been approve. Notified pharmacy with approval status...Raechel Chute

## 2013-05-01 NOTE — Telephone Encounter (Signed)
Noted,,, see previous msg...lmb

## 2013-05-01 NOTE — Telephone Encounter (Signed)
Rodman Pickle called from [facility] to inform that pt's PA have been approve for Belviq.

## 2013-05-03 IMAGING — CR DG CHEST 2V
2 series · 2 of 2 positions shown · non-contrast
Comparison: None.

CLINICAL DATA: Cough, fever, and recurrent upper respiratory
infection.

CHEST - 2 VIEW

[view not recorded (1 of 2)]
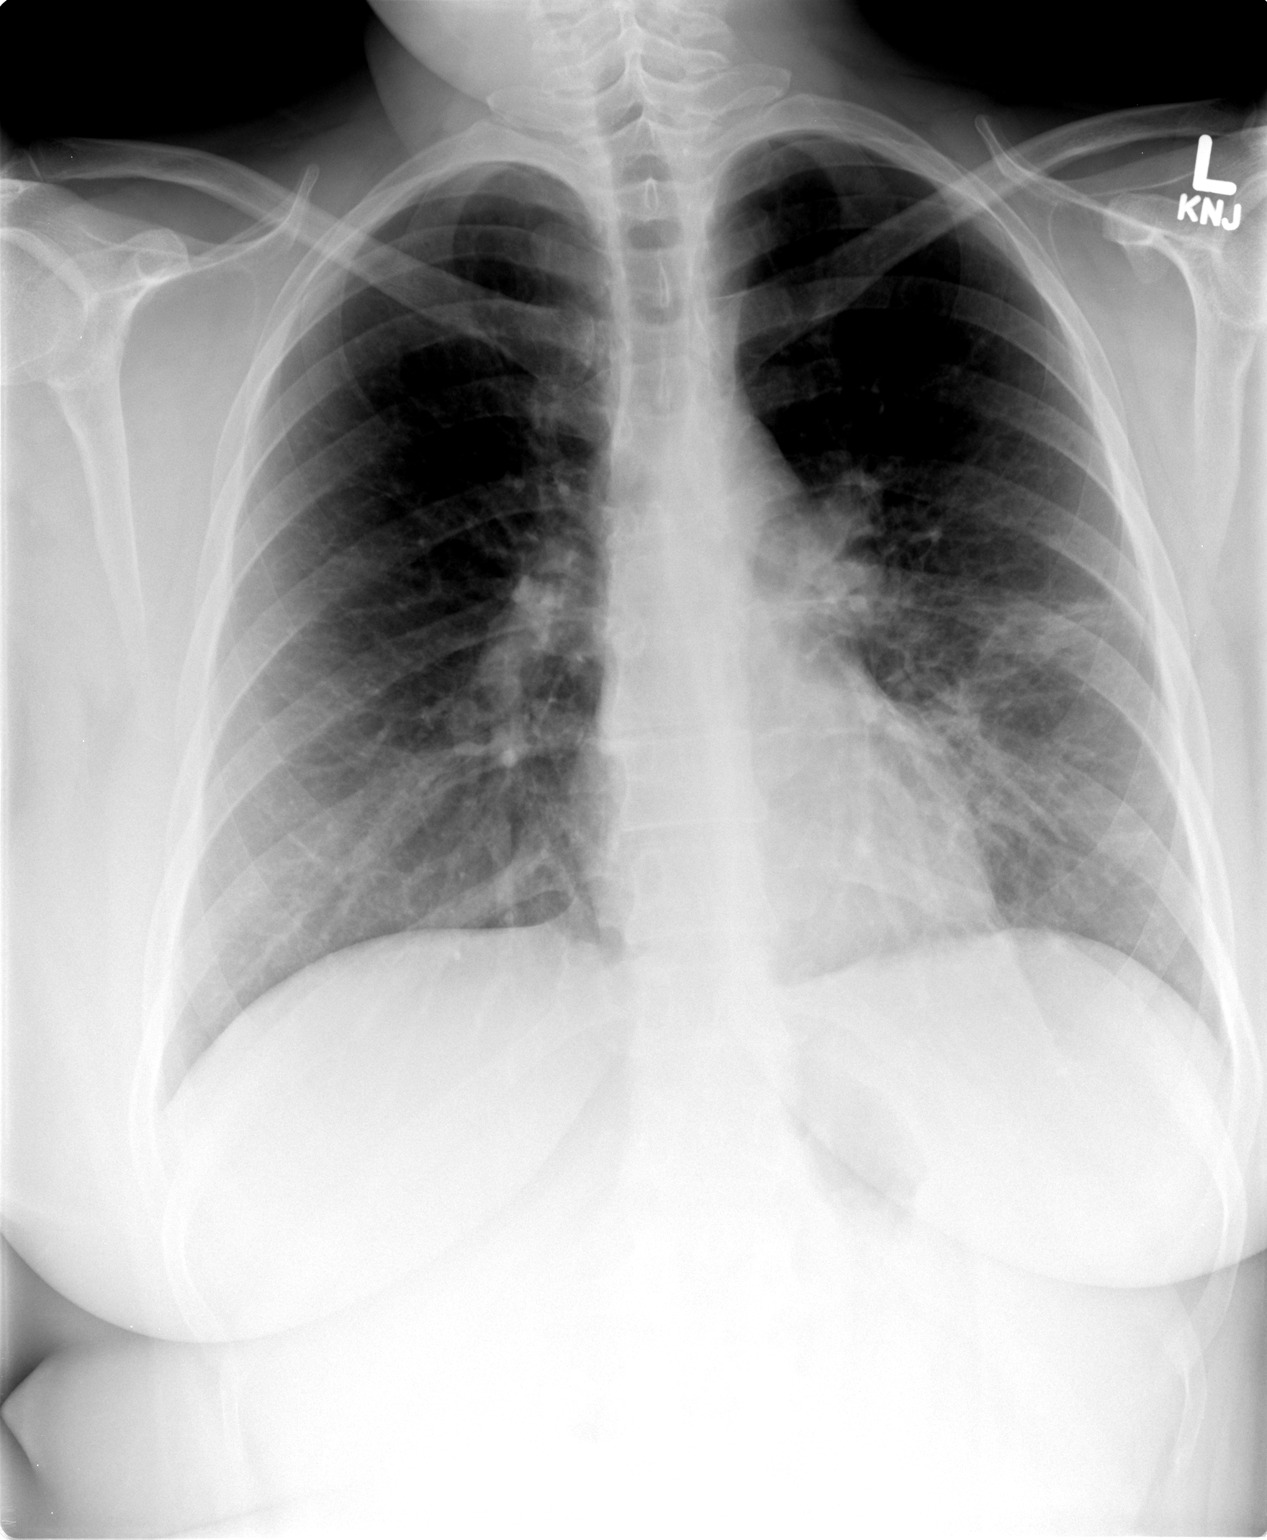

[view not recorded (2 of 2)]
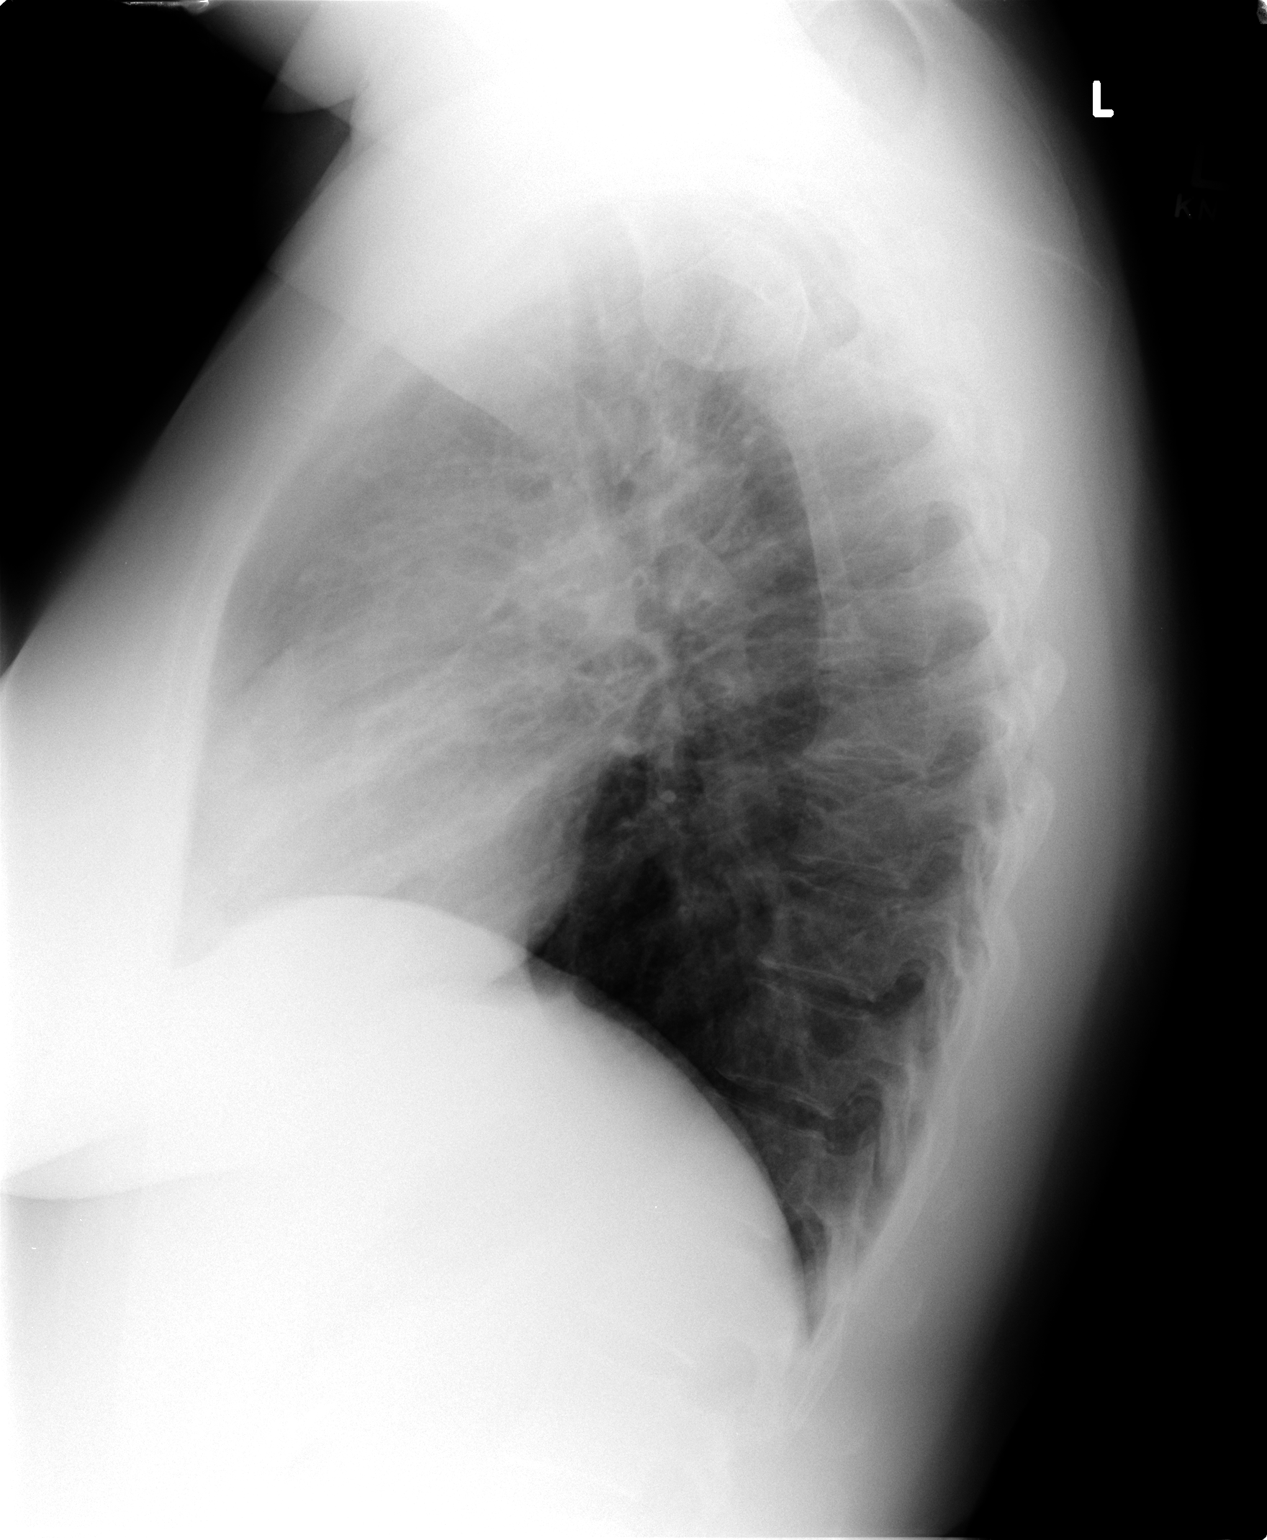

[2 of 2 positions shown; findings below may reference images not displayed]

FINDINGS: Normal heart size.  Normal mediastinal and hilar
contours.  There is slight peribronchial thickening, more prominent
on the left than right.  In the left perihilar region anteriorly is
a patchy opacity, could which could reflect early / mild changes of
pneumonia.  No focal opacities on the right.  The trachea is
midline.  There is no pleural effusion.  No acute or suspicious
bony abnormality.  Cholecystectomy clips noted.
IMPRESSION: Focal opacity left upper lobe, perihilar region.  Findings are
suspicious for pneumonia given the history of cough and fever.
Atelectasis can have a similar appearance.  Radiographic follow-up
to clearing is suggested.

This study is made a call report.

## 2013-12-26 ENCOUNTER — Encounter: Payer: Self-pay | Admitting: Family Medicine

## 2013-12-26 ENCOUNTER — Ambulatory Visit (INDEPENDENT_AMBULATORY_CARE_PROVIDER_SITE_OTHER): Payer: BLUE CROSS/BLUE SHIELD | Admitting: Family Medicine

## 2013-12-26 VITALS — BP 130/86 | HR 95 | Temp 97.9°F | Wt 262.0 lb

## 2013-12-26 DIAGNOSIS — H6121 Impacted cerumen, right ear: Secondary | ICD-10-CM

## 2013-12-26 DIAGNOSIS — H6120 Impacted cerumen, unspecified ear: Secondary | ICD-10-CM

## 2013-12-26 NOTE — Patient Instructions (Addendum)
Very nice to meet you and happy new year.  No more Q tips 1:1 mixture of rubbing alcohol and white vinegar can help keep ear wax at bay.  Use after shower is always good.  Come back if any other trouble with hearing.   Cerumen Impaction A cerumen impaction is when the wax in your ear forms a plug. This plug usually causes reduced hearing. Sometimes it also causes an earache or dizziness. Removing a cerumen impaction can be difficult and painful. The wax sticks to the ear canal. The canal is sensitive and bleeds easily. If you try to remove a heavy wax buildup with a cotton tipped swab, you may push it in further. Irrigation with water, suction, and small ear curettes may be used to clear out the wax. If the impaction is fixed to the skin in the ear canal, ear drops may be needed for a few days to loosen the wax. People who build up a lot of wax frequently can use ear wax removal products available in your local drugstore. SEEK MEDICAL CARE IF:  You develop an earache, increased hearing loss, or marked dizziness. Document Released: 01/18/2005 Document Revised: 03/04/2012 Document Reviewed: 03/10/2010 [facility] Patient Information 2014 [place]

## 2013-12-26 NOTE — Progress Notes (Signed)
SUBJECTIVE:  Monica May is a 38 y.o. female who complains of trouble hearing out of the right ear for 5 days. She denies a history of chest pain, chills, dizziness, fatigue, fevers, myalgias, shortness of breath, vomiting, weakness, weight loss, wheezing, cough and no recent illnesses and denies a history of asthma. Patient denies smoke cigarettes. Patient does have 10 Dov expected no new ones.  Denies fever, chills, nausea vomiting abdominal pain, dysuria, chest pain, shortness of breath dyspnea on exertion or numbness in extremities  Past medical history, social, surgical and family history all reviewed.     OBJECTIVE: Blood pressure 130/86, pulse 95, temperature 97.9 F (36.6 C), temperature source Oral, weight 262 lb (118.842 kg), SpO2 97.00%.  She appears well, vital signs are as noted. Ears significant ear wax bilaterally.  Throat and pharynx normal.  Neck supple. No adenopathy in the neck. Nose is not congested. Sinuses non tender. The chest is clear, without wheezes or rales.  After ear wax removal patient's impending membranes are visualized and normal. Patient was able to hear appropriately.  ASSESSMENT:  Cerumen impaction  PLAN: Discussed not using Q-tips. The discussed one-to-one mixture of rubbing alcohol and white vinegar. Patient's hearing continues to be a problem she'll come back for further evaluation and likely will need a audiology  referral.

## 2013-12-26 NOTE — Progress Notes (Signed)
Pre-visit discussion using our clinic review tool. No additional management support is needed unless otherwise documented below in the visit note.  

## 2014-01-07 ENCOUNTER — Ambulatory Visit: Payer: BLUE CROSS/BLUE SHIELD | Admitting: Internal Medicine

## 2014-01-20 ENCOUNTER — Ambulatory Visit (INDEPENDENT_AMBULATORY_CARE_PROVIDER_SITE_OTHER): Payer: BLUE CROSS/BLUE SHIELD | Admitting: Internal Medicine

## 2014-01-20 ENCOUNTER — Encounter: Payer: Self-pay | Admitting: Internal Medicine

## 2014-01-20 VITALS — BP 120/90 | HR 102 | Temp 98.9°F | Wt 258.0 lb

## 2014-01-20 DIAGNOSIS — J069 Acute upper respiratory infection, unspecified: Secondary | ICD-10-CM

## 2014-01-20 MED ORDER — AZITHROMYCIN 250 MG PO TABS: ORAL_TABLET | ORAL | Status: AC

## 2014-01-20 MED ORDER — PROMETHAZINE-CODEINE 6.25-10 MG/5ML PO SYRP: 5.0000 mL | ORAL_SOLUTION | ORAL | Status: AC | PRN

## 2014-01-20 NOTE — Patient Instructions (Signed)
Upper respiratory infection -  By history this may have been viral initially but now with persistent cough (4 days), change in mucus to colored,thick and odd tasting there may have been conversion to early bacterial infection.  Plan  Azithromycin (Z-pak) - 2 tabs today, 1 tab daily for 4 days = 10 days of coverage  Phenergan with codeine cough syrup 1-2 tsp at bedtime.  Robitussin DM or the equivalent for daytime use: 1 tsp every 4 hours as needed.  Hydrate  Tylenol 500mg  1-2 every 8 hours   Vitamin C - either citrus or tabs  Rest.   Upper Respiratory Infection, Adult An upper respiratory infection (URI) is also sometimes known as the common cold. The upper respiratory tract includes the nose, sinuses, throat, trachea, and bronchi. Bronchi are the airways leading to the lungs. Most people improve within 1 week, but symptoms can last up to 2 weeks. A residual cough may last even longer.  CAUSES Many different viruses can infect the tissues lining the upper respiratory tract. The tissues become irritated and inflamed and often become very moist. Mucus production is also common. A cold is contagious. You can easily spread the virus to others by oral contact. This includes kissing, sharing a glass, coughing, or sneezing. Touching your mouth or nose and then touching a surface, which is then touched by another person, can also spread the virus. SYMPTOMS  Symptoms typically develop 1 to 3 days after you come in contact with a cold virus. Symptoms vary from person to person. They may include:  Runny nose.  Sneezing.  Nasal congestion.  Sinus irritation.  Sore throat.  Loss of voice (laryngitis).  Cough.  Fatigue.  Muscle aches.  Loss of appetite.  Headache.  Low-grade fever. DIAGNOSIS  You might diagnose your own cold based on familiar symptoms, since most people get a cold 2 to 3 times a year. Your caregiver can confirm this based on your exam. Most importantly, your caregiver can  check that your symptoms are not due to another disease such as strep throat, sinusitis, pneumonia, asthma, or epiglottitis. Blood tests, throat tests, and X-rays are not necessary to diagnose a common cold, but they may sometimes be helpful in excluding other more serious diseases. Your caregiver will decide if any further tests are required. RISKS AND COMPLICATIONS  You may be at risk for a more severe case of the common cold if you smoke cigarettes, have chronic heart disease (such as heart failure) or lung disease (such as asthma), or if you have a weakened immune system. The very young and very old are also at risk for more serious infections. Bacterial sinusitis, middle ear infections, and bacterial pneumonia can complicate the common cold. The common cold can worsen asthma and chronic obstructive pulmonary disease (COPD). Sometimes, these complications can require emergency medical care and may be life-threatening. PREVENTION  The best way to protect against getting a cold is to practice good hygiene. Avoid oral or hand contact with people with cold symptoms. Wash your hands often if contact occurs. There is no clear evidence that vitamin C, vitamin E, echinacea, or exercise reduces the chance of developing a cold. However, it is always recommended to get plenty of rest and practice good nutrition. TREATMENT  Treatment is directed at relieving symptoms. There is no cure. Antibiotics are not effective, because the infection is caused by a virus, not by bacteria. Treatment may include:  Increased fluid intake. Sports drinks offer valuable electrolytes, sugars, and fluids.  Breathing heated mist or steam (vaporizer or shower).  Eating chicken soup or other clear broths, and maintaining good nutrition.  Getting plenty of rest.  Using gargles or lozenges for comfort.  Controlling fevers with ibuprofen or acetaminophen as directed by your caregiver.  Increasing usage of your inhaler if you have  asthma. Zinc gel and zinc lozenges, taken in the first 24 hours of the common cold, can shorten the duration and lessen the severity of symptoms. Pain medicines may help with fever, muscle aches, and throat pain. A variety of non-prescription medicines are available to treat congestion and runny nose. Your caregiver can make recommendations and may suggest nasal or lung inhalers for other symptoms.  HOME CARE INSTRUCTIONS   Only take over-the-counter or prescription medicines for pain, discomfort, or fever as directed by your caregiver.  Use a warm mist humidifier or inhale steam from a shower to increase air moisture. This may keep secretions moist and make it easier to breathe.  Drink enough water and fluids to keep your urine clear or pale yellow.  Rest as needed.  Return to work when your temperature has returned to normal or as your caregiver advises. You may need to stay home longer to avoid infecting others. You can also use a face mask and careful hand washing to prevent spread of the virus. SEEK MEDICAL CARE IF:   After the first few days, you feel you are getting worse rather than better.  You need your caregiver's advice about medicines to control symptoms.  You develop chills, worsening shortness of breath, or brown or red sputum. These may be signs of pneumonia.  You develop yellow or brown nasal discharge or pain in the face, especially when you bend forward. These may be signs of sinusitis.  You develop a fever, swollen neck glands, pain with swallowing, or white areas in the back of your throat. These may be signs of strep throat. SEEK IMMEDIATE MEDICAL CARE IF:   You have a fever.  You develop severe or persistent headache, ear pain, sinus pain, or chest pain.  You develop wheezing, a prolonged cough, cough up blood, or have a change in your usual mucus (if you have chronic lung disease).  You develop sore muscles or a stiff neck. Document Released: 06/06/2001  Document Revised: 03/04/2012 Document Reviewed: 04/14/2011 [facility] Patient Information 2014 [place], [place].

## 2014-01-20 NOTE — Progress Notes (Signed)
Pre visit review using our clinic review tool, if applicable. No additional management support is needed unless otherwise documented below in the visit note. 

## 2014-01-20 NOTE — Progress Notes (Signed)
Subjective:    Patient ID: Monica May, female    DOB: [birth year], 38 y.o.   MRN: [id number]  HPI Monica May presents with a 4 day h/o coughing, wheezing, congestion. Cough productive of a clear sputum that has now become colored, thick, tastes bitter/metallic, anorexic. Temperature at home has been 98-99. Mild nausea w/o emesis, positve for loose stools but not more than 6 times per day. She has tried Information systems manageralkaselzer, theraflu, Monica May, Monica May, Monica May. No relief with any of these products except the alkaselzer cold and flu gel cap.  Past Medical History  Diagnosis Date  . OBESITY, MORBID   . DEPRESSION   . MIGRAINE HEADACHE   . Bipolar affective disorder     dr. Marciano May ([place])  . 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 Sjogrens syndrome    Past Surgical History  Procedure Laterality Date  . Cholecystectomy  2006  . Abdominal hysterectomy  2008    with BSO  . Right foot tumor resection  2003   Family History  Problem Relation Age of Onset  . Arthritis Mother   . Hypertension Mother   . Diabetes Maternal Grandmother   . Alcohol abuse Other    History   Social History  . Marital Status: Single    Spouse Name: N/A    Number of Children: N/A  . Years of Education: N/A   Occupational History  . Not on file.   Social History Main Topics  . Smoking status: Never Smoker   . Smokeless tobacco: Not on file     Comment: Single, lives with dogs, not in relationship. works with [employer]  . Alcohol Use: No  . Drug Use: No  . Sexual Activity:    Other Topics Concern  . Not on file   Social History Narrative  . No narrative on file    Current Outpatient Prescriptions on File Prior to Visit  Medication Sig Dispense Refill  . ARIPiprazole (ABILIFY) 2 MG tablet Take 2 mg by mouth daily.      . bifidobacterium infantis (ALIGN) capsule Take 1 capsule by mouth daily.  30 capsule  11  . gabapentin (NEURONTIN) 300 MG  capsule Take 300 mg by mouth at bedtime.       No current facility-administered medications on file prior to visit.   Allergies  Allergen Reactions  . Ciprofloxacin   . Penicillins   . Piperacillin Sod-Tazobactam So   . Vancomycin       Review of Systems System review is negative for any constitutional, cardiac, pulmonary, GI or neuro symptoms or complaints other than as described in the HPI.     Objective:   Physical Exam Filed Vitals:   01/20/14 1636  BP: 120/90  Pulse: 102  Temp: 98.9 F (37.2 C)   BP Readings from Last 3 Encounters:  01/20/14 120/90  12/26/13 130/86  04/21/13 102/78   Wt Readings from Last 3 Encounters:  01/20/14 258 lb (117.028 kg)  12/26/13 262 lb (118.842 kg)  04/21/13 255 lb 6.4 oz (115.849 kg)   Gen'l - overweight woman in no acute distress HEENT- TMs normal, throat clear, minimal tenderness to percussion over the facial sinuses Cor - RRR Pulm - no increased WOB, good air movement , no rales or wheezes Neuro - A&O x 3       Assessment & Plan:  Upper respiratory infection -  By history this may have  been viral initially but now with persistent cough (4 days), change in mucus to colored,thick and odd tasting there may have been conversion to early bacterial infection.  Plan  Azithromycin (Z-pak) - 2 tabs today, 1 tab daily for 4 days = 10 days of coverage  Phenergan with codeine cough syrup 1-2 tsp at bedtime.  Robitussin DM or the equivalent for daytime use: 1 tsp every 4 hours as needed.  Hydrate  Tylenol 500mg  1-2 every 8 hours   Vitamin C - either citrus or tabs  Rest.

## 2014-09-01 ENCOUNTER — Telehealth: Payer: Self-pay | Admitting: Internal Medicine

## 2014-09-01 NOTE — Telephone Encounter (Signed)
Rec'd from [facility] forward 6 pages to Dr. Debby Bud
